# Patient Record
Sex: Male | Born: 1965 | Race: White | Hispanic: No | Marital: Single | Smoking: Current every day smoker
Health system: Southern US, Community
[De-identification: ages and names within clinical notes are randomized; demographics above are authoritative.]

## PROBLEM LIST (undated history)

## (undated) HISTORY — PX: TOTAL KNEE ARTHROPLASTY: SHX125

---

## 1998-07-31 ENCOUNTER — Encounter: Payer: Self-pay | Admitting: Emergency Medicine

## 1998-07-31 ENCOUNTER — Emergency Department (HOSPITAL_COMMUNITY): Admission: EM | Admit: 1998-07-31 | Discharge: 1998-07-31 | Payer: Self-pay | Admitting: Emergency Medicine

## 2010-05-01 ENCOUNTER — Other Ambulatory Visit: Payer: Self-pay | Admitting: Occupational Medicine

## 2010-05-01 ENCOUNTER — Ambulatory Visit: Payer: Self-pay

## 2010-05-01 DIAGNOSIS — M79643 Pain in unspecified hand: Secondary | ICD-10-CM

## 2012-12-11 ENCOUNTER — Emergency Department (HOSPITAL_COMMUNITY)
Admission: EM | Admit: 2012-12-11 | Discharge: 2012-12-11 | Disposition: A | Discharge status: Home / Self Care | Payer: No Typology Code available for payment source | Attending: Emergency Medicine | Admitting: Emergency Medicine

## 2012-12-11 ENCOUNTER — Encounter (HOSPITAL_COMMUNITY): Payer: Self-pay | Admitting: *Deleted

## 2012-12-11 DIAGNOSIS — K029 Dental caries, unspecified: Secondary | ICD-10-CM | POA: Insufficient documentation

## 2012-12-11 DIAGNOSIS — K0520 Aggressive periodontitis, unspecified: Secondary | ICD-10-CM | POA: Insufficient documentation

## 2012-12-11 DIAGNOSIS — Z720 Tobacco use: Secondary | ICD-10-CM

## 2012-12-11 DIAGNOSIS — J029 Acute pharyngitis, unspecified: Secondary | ICD-10-CM

## 2012-12-11 DIAGNOSIS — F17200 Nicotine dependence, unspecified, uncomplicated: Secondary | ICD-10-CM | POA: Insufficient documentation

## 2012-12-11 DIAGNOSIS — J039 Acute tonsillitis, unspecified: Secondary | ICD-10-CM | POA: Insufficient documentation

## 2012-12-11 DIAGNOSIS — K0530 Chronic periodontitis, unspecified: Secondary | ICD-10-CM

## 2012-12-11 LAB — RAPID STREP SCREEN (MED CTR MEBANE ONLY): Streptococcus, Group A Screen (Direct): NEGATIVE

## 2012-12-11 MED ORDER — PENICILLIN G BENZATHINE & PROC 1200000 UNIT/2ML IM SUSP
2.4000 10*6.[IU] | Freq: Once | INTRAMUSCULAR | Status: AC
  Administered 2012-12-11: 2.4 10*6.[IU] via INTRAMUSCULAR
  Filled 2012-12-11: qty 4

## 2012-12-11 NOTE — ED Notes (Signed)
Penicillin given in deep muscle upper outer portion of L and R buttocks.  Pt tolerated well.

## 2012-12-11 NOTE — ED Provider Notes (Signed)
CSN: [id number]     Arrival date & time 12/11/12  0536 History   First MD Initiated Contact with Patient 12/11/12 0542     Chief Complaint  Patient presents with  . Sore Throat   (Consider location/radiation/quality/duration/timing/severity/associated sxs/prior Treatment) HPI Patient is a 47 yo man with active periodontal disease who smokes 1ppd. He presents with complaints of sore throat x 24h. Pain is moderately severe and nonradiating. Pt is able to swallow. No sensation of airway closing.   Patient notes that he has severe periodontal disease and says that he thinks an infection spread from his gums to his throat. He denies URI sx. Pain in throat is nonradiating. Worse with swallowing.   History reviewed. No pertinent past medical history. Past Surgical History  Procedure Laterality Date  . Total knee arthroplasty     History reviewed. No pertinent family history. History  Substance Use Topics  . Smoking status: Current Every Day Smoker  . Smokeless tobacco: Never Used  . Alcohol Use: No    Review of Systems 10 point ROS obtained and otherwise negative.   Allergies  Review of patient's allergies indicates no known allergies.  Home Medications  No current outpatient prescriptions on file. BP 143/75  Temp(Src) 98.4 F (36.9 C) (Oral)  Resp 18  SpO2 96% Physical Exam Gen: well developed and well nourished appearing Head: NCAT Eyes: PERL, EOMI Nose: no epistaixis or rhinorrhea Mouth/throat: mucosa is moist and pink, diffuse dental caries, diffuse and severe periodontal disease. There is inflammation of the posterior oropharynx with scant amount of exudate of the tonsils bilaterally, the uvula is midline Neck: supple, no stridor, no lymphadenopathy Lungs: CTA B, no wheezing, rhonchi or rales CV: RRR Abd: soft, notender, nondistended Back: normal to inspection Skin: warm and dry Neuro: CN ii-xii grossly intact, no focal deficits Psyche; normal affect,  calm and  cooperative.   ED Course  Procedures (including critical care time)   MDM  1. Tonsillitis 2.  Periodontal disease 3.  Tobacco abuse disorder.   We will tx #1 with Benzanthine PCN IM. I have counseled the patient re: importance of smoking cessation. He says he has cut down from 3ppd and is working on quitting. Unfortunately, the patient does not have dental insurance and can't afford to see a dentist, he says. He is in need of aggressive periodontal care.  He has diffuse dental caries and we will refer [facility] on Sept [facility] and Sept 27.     Brandt Loosen, MD 12/11/12 [phone]

## 2012-12-11 NOTE — ED Notes (Signed)
Pt c/o sore throat for 3 days. Pt reports difficulty swallowing. Airway intact, no respiratory distress noted.

## 2012-12-12 LAB — CULTURE, GROUP A STREP

## 2013-01-21 ENCOUNTER — Emergency Department (HOSPITAL_COMMUNITY)
Admission: EM | Admit: 2013-01-21 | Discharge: 2013-01-21 | Disposition: A | Discharge status: Home / Self Care | Payer: No Typology Code available for payment source | Attending: Emergency Medicine | Admitting: Emergency Medicine

## 2013-01-21 ENCOUNTER — Encounter (HOSPITAL_COMMUNITY): Payer: Self-pay | Admitting: Emergency Medicine

## 2013-01-21 ENCOUNTER — Emergency Department (HOSPITAL_COMMUNITY): Payer: No Typology Code available for payment source

## 2013-01-21 DIAGNOSIS — IMO0002 Reserved for concepts with insufficient information to code with codable children: Secondary | ICD-10-CM

## 2013-01-21 DIAGNOSIS — Y939 Activity, unspecified: Secondary | ICD-10-CM | POA: Insufficient documentation

## 2013-01-21 DIAGNOSIS — Y929 Unspecified place or not applicable: Secondary | ICD-10-CM | POA: Insufficient documentation

## 2013-01-21 DIAGNOSIS — S62639A Displaced fracture of distal phalanx of unspecified finger, initial encounter for closed fracture: Secondary | ICD-10-CM | POA: Insufficient documentation

## 2013-01-21 DIAGNOSIS — F17200 Nicotine dependence, unspecified, uncomplicated: Secondary | ICD-10-CM | POA: Insufficient documentation

## 2013-01-21 DIAGNOSIS — S61209A Unspecified open wound of unspecified finger without damage to nail, initial encounter: Secondary | ICD-10-CM | POA: Insufficient documentation

## 2013-01-21 DIAGNOSIS — W268XXA Contact with other sharp object(s), not elsewhere classified, initial encounter: Secondary | ICD-10-CM | POA: Insufficient documentation

## 2013-01-21 MED ORDER — CEPHALEXIN 500 MG PO CAPS: 500.0000 mg | ORAL_CAPSULE | Freq: Four times a day (QID) | ORAL | Status: DC

## 2013-01-21 MED ORDER — OXYCODONE-ACETAMINOPHEN 5-325 MG PO TABS
2.0000 | ORAL_TABLET | Freq: Once | ORAL | Status: AC
  Administered 2013-01-21: 2 via ORAL
  Filled 2013-01-21: qty 2

## 2013-01-21 MED ORDER — OXYCODONE-ACETAMINOPHEN 5-325 MG PO TABS: 2.0000 | ORAL_TABLET | Freq: Four times a day (QID) | ORAL | Status: DC | PRN
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
## 2013-01-21 NOTE — ED Notes (Signed)
C/o laceration/avulsion to L thumb from the string on a crossbow around 7pm.  Bandage changed on arrival.  Bleeding controlled. Last DT 3 yrs ago.

## 2013-01-22 NOTE — ED Provider Notes (Signed)
Medical screening examination/treatment/procedure(s) were performed by non-physician practitioner and as supervising physician I was immediately available for consultation/collaboration.  EKG Interpretation   None         Katilin Raynes, MD 01/22/13 0003 

## 2013-08-16 ENCOUNTER — Encounter (HOSPITAL_COMMUNITY): Payer: Self-pay | Admitting: Emergency Medicine

## 2013-08-16 ENCOUNTER — Emergency Department (HOSPITAL_COMMUNITY): Payer: No Typology Code available for payment source

## 2013-08-16 ENCOUNTER — Emergency Department (HOSPITAL_COMMUNITY)
Admission: EM | Admit: 2013-08-16 | Discharge: 2013-08-16 | Disposition: A | Discharge status: Home / Self Care | Payer: No Typology Code available for payment source | Attending: Emergency Medicine | Admitting: Emergency Medicine

## 2013-08-16 DIAGNOSIS — Z96659 Presence of unspecified artificial knee joint: Secondary | ICD-10-CM | POA: Insufficient documentation

## 2013-08-16 DIAGNOSIS — Z87828 Personal history of other (healed) physical injury and trauma: Secondary | ICD-10-CM | POA: Insufficient documentation

## 2013-08-16 DIAGNOSIS — M1710 Unilateral primary osteoarthritis, unspecified knee: Secondary | ICD-10-CM | POA: Insufficient documentation

## 2013-08-16 DIAGNOSIS — F17200 Nicotine dependence, unspecified, uncomplicated: Secondary | ICD-10-CM | POA: Insufficient documentation

## 2013-08-16 DIAGNOSIS — IMO0002 Reserved for concepts with insufficient information to code with codable children: Secondary | ICD-10-CM | POA: Insufficient documentation

## 2013-08-16 MED ORDER — PREDNISONE 10 MG PO TABS: 20.0000 mg | ORAL_TABLET | Freq: Every day | ORAL | Status: AC

## 2013-08-16 MED ORDER — OXYCODONE-ACETAMINOPHEN 5-325 MG PO TABS: 1.0000 | ORAL_TABLET | Freq: Four times a day (QID) | ORAL | Status: AC | PRN

## 2013-08-16 MED ORDER — OXYCODONE-ACETAMINOPHEN 5-325 MG PO TABS
2.0000 | ORAL_TABLET | Freq: Once | ORAL | Status: AC
  Administered 2013-08-16: 2 via ORAL
  Filled 2013-08-16: qty 2

## 2013-08-16 MED ORDER — OXYCODONE-ACETAMINOPHEN 5-325 MG PO TABS
1.0000 | ORAL_TABLET | Freq: Once | ORAL | Status: AC
  Administered 2013-08-16: 1 via ORAL
  Filled 2013-08-16: qty 1

## 2013-08-16 NOTE — Discharge Instructions (Signed)
Osteoarthritis °Osteoarthritis is a disease that causes soreness and swelling (inflammation) of a joint. It occurs when the cartilage at the affected joint wears down. Cartilage acts as a cushion, covering the ends of bones where they meet to form a joint. Osteoarthritis is the most common form of arthritis. It often occurs in older people. The joints affected most often by this condition include those in the: °· Ends of the fingers. °· Thumbs. °· Neck. °· Lower back. °· Knees. °· Hips. °CAUSES  °Over time, the cartilage that covers the ends of bones begins to wear away. This causes bone to rub on bone, producing pain and stiffness in the affected joints.  °RISK FACTORS °Certain factors can increase your chances of having osteoarthritis, including: °· Older age. °· Excessive body weight. °· Overuse of joints. °SIGNS AND SYMPTOMS  °· Pain, swelling, and stiffness in the joint. °· Over time, the joint may lose its normal shape. °· Small deposits of bone (osteophytes) may grow on the edges of the joint. °· Bits of bone or cartilage can break off and float inside the joint space. This may cause more pain and damage. °DIAGNOSIS  °Your health care provider will do a physical exam and ask about your symptoms. Various tests may be ordered, such as: °· X-rays of the affected joint. °· An MRI scan. °· Blood tests to rule out other types of arthritis. °· Joint fluid tests. This involves using a needle to draw fluid from the joint and examining the fluid under a microscope. °TREATMENT  °Goals of treatment are to control pain and improve joint function. Treatment plans may include: °· A prescribed exercise program that allows for rest and joint relief. °· A weight control plan. °· Pain relief techniques, such as: °· Properly applied heat and cold. °· Electric pulses delivered to nerve endings under the skin (transcutaneous electrical nerve stimulation, TENS). °· Massage. °· Certain nutritional supplements. °· Medicines to  control pain, such as: °· Acetaminophen. °· Nonsteroidal anti-inflammatory drugs (NSAIDs), such as naproxen. °· Narcotic or central-acting agents, such as tramadol. °· Corticosteroids. These can be given orally or as an injection. °· Surgery to reposition the bones and relieve pain (osteotomy) or to remove loose pieces of bone and cartilage. Joint replacement may be needed in advanced states of osteoarthritis. °HOME CARE INSTRUCTIONS  °· Only take over-the-counter or prescription medicines as directed by your health care provider. Take all medicines exactly as instructed. °· Maintain a healthy weight. Follow your health care provider's instructions for weight control. This may include dietary instructions. °· Exercise as directed. Your health care provider can recommend specific types of exercise. These may include: °· Strengthening exercises These are done to strengthen the muscles that support joints affected by arthritis. They can be performed with weights or with exercise bands to add resistance. °· Aerobic activities These are exercises, such as brisk walking or low-impact aerobics, that get your heart pumping. °· Range-of-motion activities These keep your joints limber. °· Balance and agility exercises These help you maintain daily living skills. °· Rest your affected joints as directed by your health care provider. °· Follow up with your health care provider as directed. °SEEK MEDICAL CARE IF:  °· Your skin turns red. °· You develop a rash in addition to your joint pain. °· You have worsening joint pain. °SEEK IMMEDIATE MEDICAL CARE IF: °· You have a significant loss of weight or appetite. °· You have a fever along with joint or muscle aches. °· You have   night sweats. °FOR MORE INFORMATION  °National Institute of Arthritis and Musculoskeletal and Skin Diseases: www.niams.nih.gov °National Institute on Aging: www.nia.nih.gov °American College of Rheumatology: www.rheumatology.org °Document Released: 03/15/2005  Document Revised: 01/03/2013 Document Reviewed: 11/20/2012 °ExitCare® Patient Information ©2014 [employer]. ° °Wear and Tear Disorders of the Knee (Arthritis, Osteoarthritis) °Everyone will experience wear and tear injuries (arthritis, osteoarthritis) of the knee. These are the changes we all get as we age. They come from the joint stress of daily living. The amount of cartilage damage in your knee and your symptoms determine if you need surgery. Mild problems require approximately two months recovery time. More severe problems take several months to recover. With mild problems, your surgeon may find worn and rough cartilage surfaces. With severe changes, your surgeon may find cartilage that has completely worn away and exposed the bone. Loose bodies of bone and cartilage, bone spurs (excess bone growth), and injuries to the menisci (cushions between the large bones of your leg) are also common. All of these problems can cause pain. °For a mild wear and tear problem, rough cartilage may simply need to be shaved and smoothed. For more severe problems with areas of exposed bone, your surgeon may use an instrument for roughing up the bone surfaces to stimulate new cartilage growth. Loose bodies are usually removed. Torn menisci may be trimmed or repaired. °ABOUT THE ARTHROSCOPIC PROCEDURE °Arthroscopy is a surgical technique. It allows your orthopedic surgeon to diagnose and treat your knee injury with accuracy. The surgeon looks into your knee through a small scope. The scope is like a small (pencil-sized) telescope. Arthroscopy is less invasive than open knee surgery. You can expect a more rapid recovery. After the procedure, you will be moved to a recovery area until most of the effects of the medication have worn off. Your caregiver will discuss the test results with you. °RECOVERY °The severity of the arthritis and the type of procedure performed will determine recovery time. Other important factors include  age, physical condition, medical conditions, and the type of rehabilitation program. Strengthening your muscles after arthroscopy helps guarantee a better recovery. Follow your caregiver's instructions. Use crutches, rest, elevate, ice, and do knee exercises as instructed. Your caregivers will help you and instruct you with exercises and other physical therapy required to regain your mobility, muscle strength, and functioning following surgery. Only take over-the-counter or prescription medicines for pain, discomfort, or fever as directed by your caregiver.  °SEEK MEDICAL CARE IF:  °· There is increased bleeding (more than a small spot) from the wound. °· You notice redness, swelling, or increasing pain in the wound. °· Pus is coming from wound. °· You develop an unexplained oral temperature above 102° F (38.9° C) , or as your caregiver suggests. °· You notice a foul smell coming from the wound or dressing. °· You have severe pain with motion of the knee. °SEEK IMMEDIATE MEDICAL CARE IF:  °· You develop a rash. °· You have difficulty breathing. °· You have any allergic problems. °MAKE SURE YOU:  °· Understand these instructions. °· Will watch your condition. °· Will get help right away if you are not doing well or get worse. °Document Released: 03/12/2000 Document Revised: 06/07/2011 Document Reviewed: 08/09/2007 °ExitCare® Patient Information ©2014 [employer]. ° °

## 2013-08-16 NOTE — ED Notes (Signed)
Pt c/o pain to left knee onset yesterday. Pt reports that he has a knee replacement to right knee. Pt felt pain yesterday after getting up from toilet. Pt has tried ibuprofen without relief.

## 2013-08-16 NOTE — ED Notes (Signed)
Patient returned from xray.
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
## 2013-08-17 NOTE — ED Provider Notes (Signed)
Medical screening examination/treatment/procedure(s) were performed by non-physician practitioner and as supervising physician I was immediately available for consultation/collaboration.   EKG Interpretation None        Christopher J. Pollina, MD 08/17/13 0841 

## 2013-12-12 ENCOUNTER — Encounter (HOSPITAL_COMMUNITY): Payer: Self-pay | Admitting: Emergency Medicine

## 2013-12-12 ENCOUNTER — Emergency Department (HOSPITAL_COMMUNITY): Payer: No Typology Code available for payment source

## 2013-12-12 ENCOUNTER — Emergency Department (HOSPITAL_COMMUNITY)
Admission: EM | Admit: 2013-12-12 | Discharge: 2013-12-12 | Disposition: A | Discharge status: Home / Self Care | Payer: No Typology Code available for payment source | Attending: Emergency Medicine | Admitting: Emergency Medicine

## 2013-12-12 DIAGNOSIS — S99919A Unspecified injury of unspecified ankle, initial encounter: Principal | ICD-10-CM

## 2013-12-12 DIAGNOSIS — S8990XA Unspecified injury of unspecified lower leg, initial encounter: Secondary | ICD-10-CM | POA: Insufficient documentation

## 2013-12-12 DIAGNOSIS — M25562 Pain in left knee: Secondary | ICD-10-CM

## 2013-12-12 DIAGNOSIS — Z96659 Presence of unspecified artificial knee joint: Secondary | ICD-10-CM | POA: Insufficient documentation

## 2013-12-12 DIAGNOSIS — R296 Repeated falls: Secondary | ICD-10-CM | POA: Insufficient documentation

## 2013-12-12 DIAGNOSIS — S99929A Unspecified injury of unspecified foot, initial encounter: Principal | ICD-10-CM

## 2013-12-12 DIAGNOSIS — F17200 Nicotine dependence, unspecified, uncomplicated: Secondary | ICD-10-CM | POA: Insufficient documentation

## 2013-12-12 DIAGNOSIS — IMO0002 Reserved for concepts with insufficient information to code with codable children: Secondary | ICD-10-CM | POA: Insufficient documentation

## 2013-12-12 DIAGNOSIS — Y9389 Activity, other specified: Secondary | ICD-10-CM | POA: Insufficient documentation

## 2013-12-12 DIAGNOSIS — Y929 Unspecified place or not applicable: Secondary | ICD-10-CM | POA: Insufficient documentation

## 2013-12-12 MED ORDER — OXYCODONE-ACETAMINOPHEN 5-325 MG PO TABS: 1.0000 | ORAL_TABLET | ORAL | Status: AC | PRN

## 2013-12-12 MED ORDER — OXYCODONE-ACETAMINOPHEN 5-325 MG PO TABS
2.0000 | ORAL_TABLET | Freq: Once | ORAL | Status: AC
  Administered 2013-12-12: 2 via ORAL
  Filled 2013-12-12: qty 2

## 2013-12-12 MED ORDER — ONDANSETRON 4 MG PO TBDP
8.0000 mg | ORAL_TABLET | Freq: Once | ORAL | Status: AC
  Administered 2013-12-12: 8 mg via ORAL
  Filled 2013-12-12: qty 2

## 2013-12-12 NOTE — ED Notes (Signed)
Pt reports pain to left knee this am and his "knee gave out on him." ambulatory at triage.

## 2013-12-12 NOTE — Discharge Instructions (Signed)
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
## 2013-12-15 NOTE — ED Provider Notes (Signed)
Medical screening examination/treatment/procedure(s) were performed by non-physician practitioner and as supervising physician I was immediately available for consultation/collaboration.  Audree Camel, MD 12/15/13 [phone]

## 2015-02-27 IMAGING — CR DG KNEE COMPLETE 4+V*L*
4 series · 4 of 4 positions shown · non-contrast
Comparison: Left knee series of August 16, 2013

CLINICAL DATA: Status post fall this morning now with pain over the
medial aspect of the knee

EXAM:
LEFT KNEE - COMPLETE 4+ VIEW

[t knee ap left]
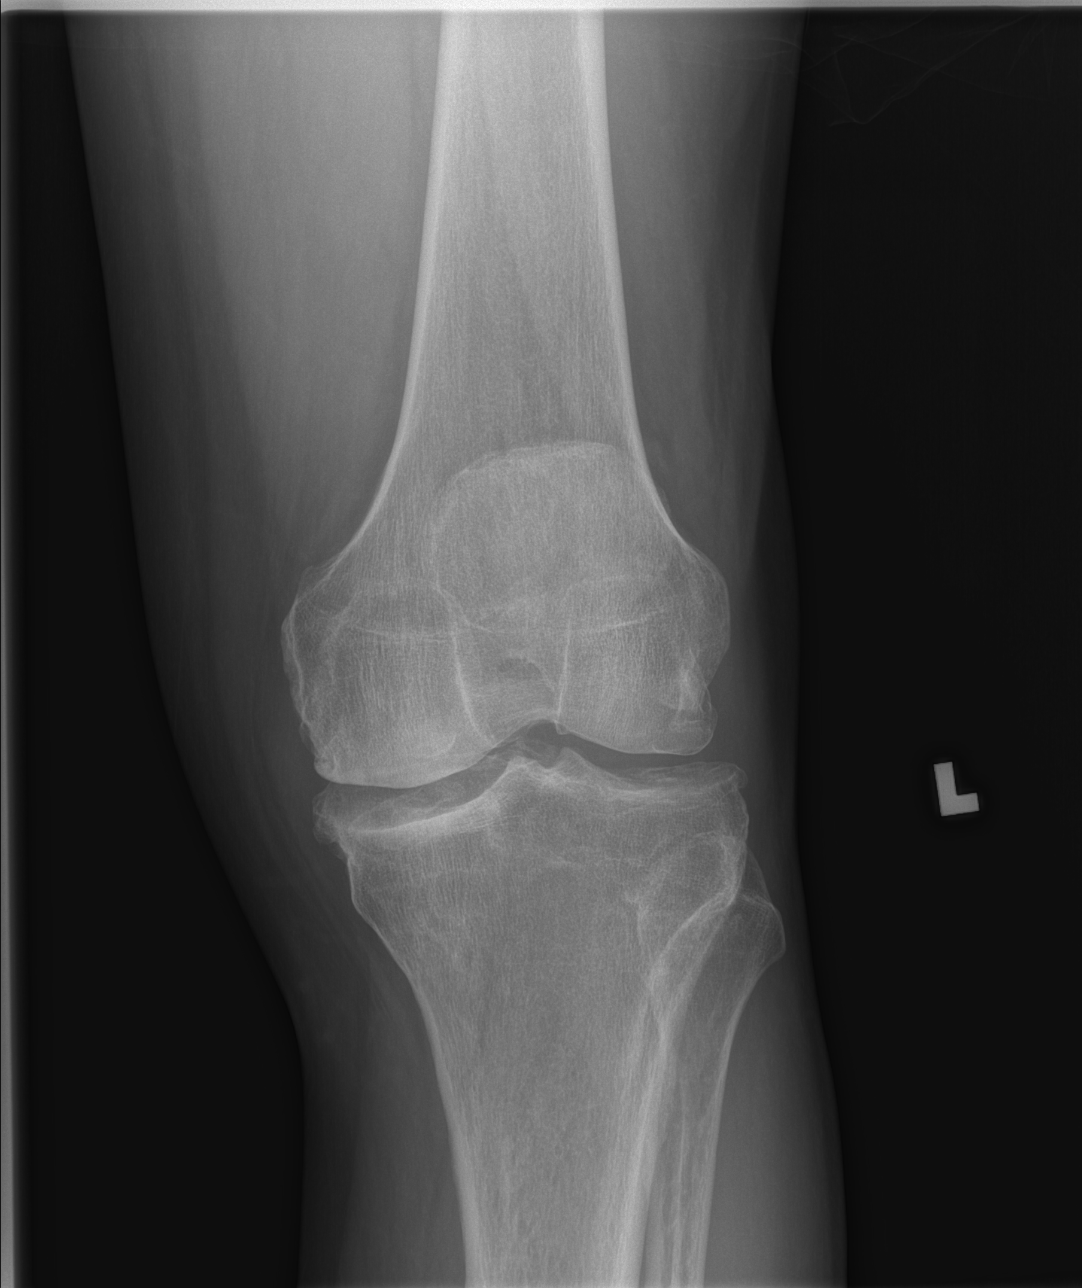

[t knee obl left (1 of 2)]
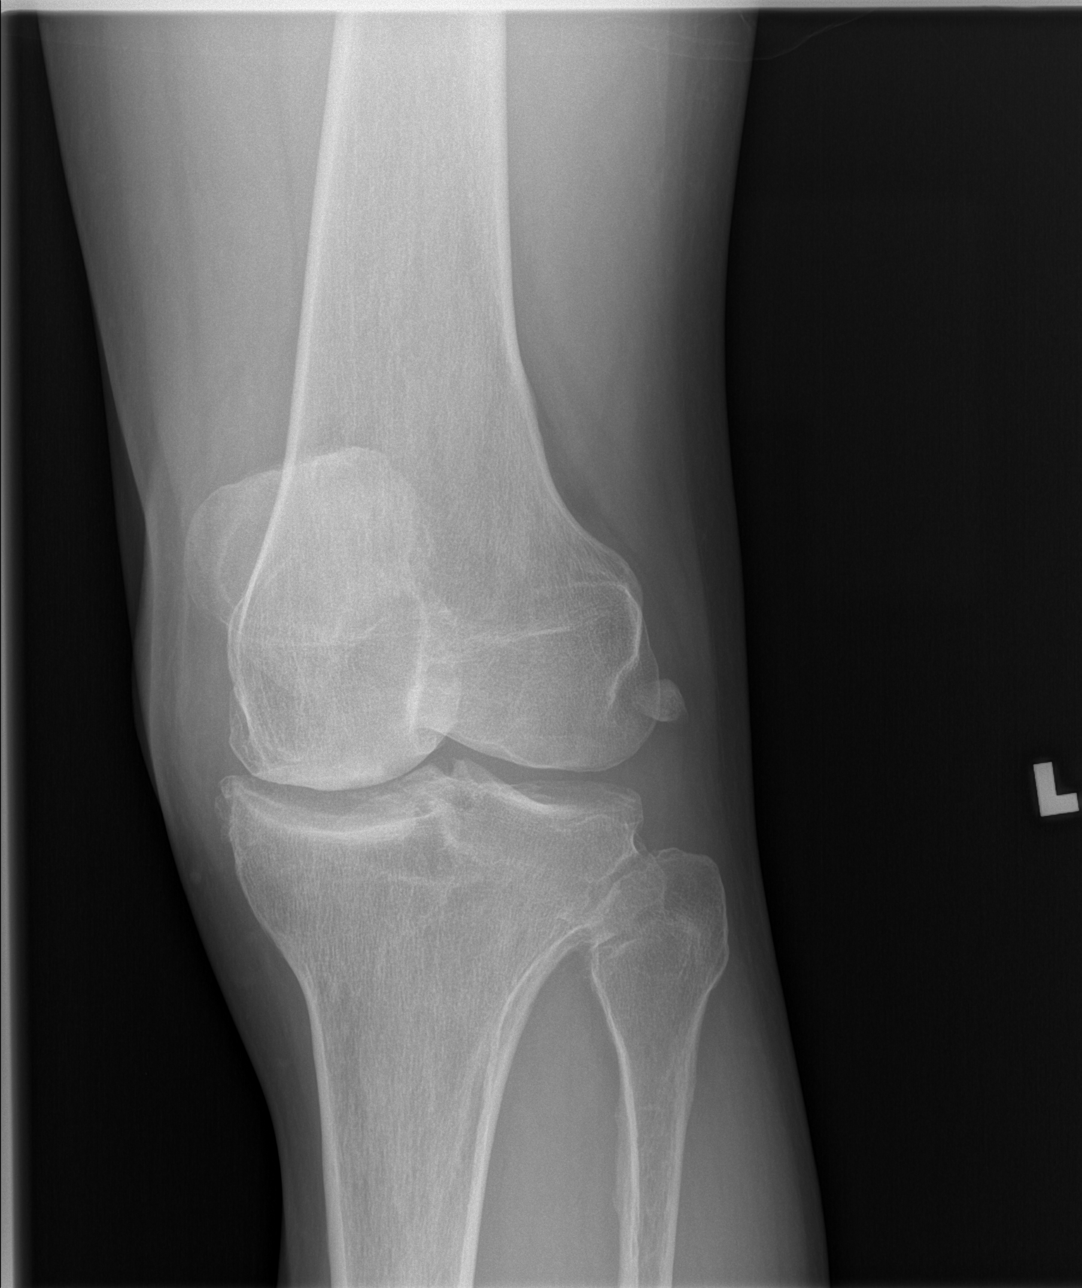

[t knee obl left (2 of 2)]
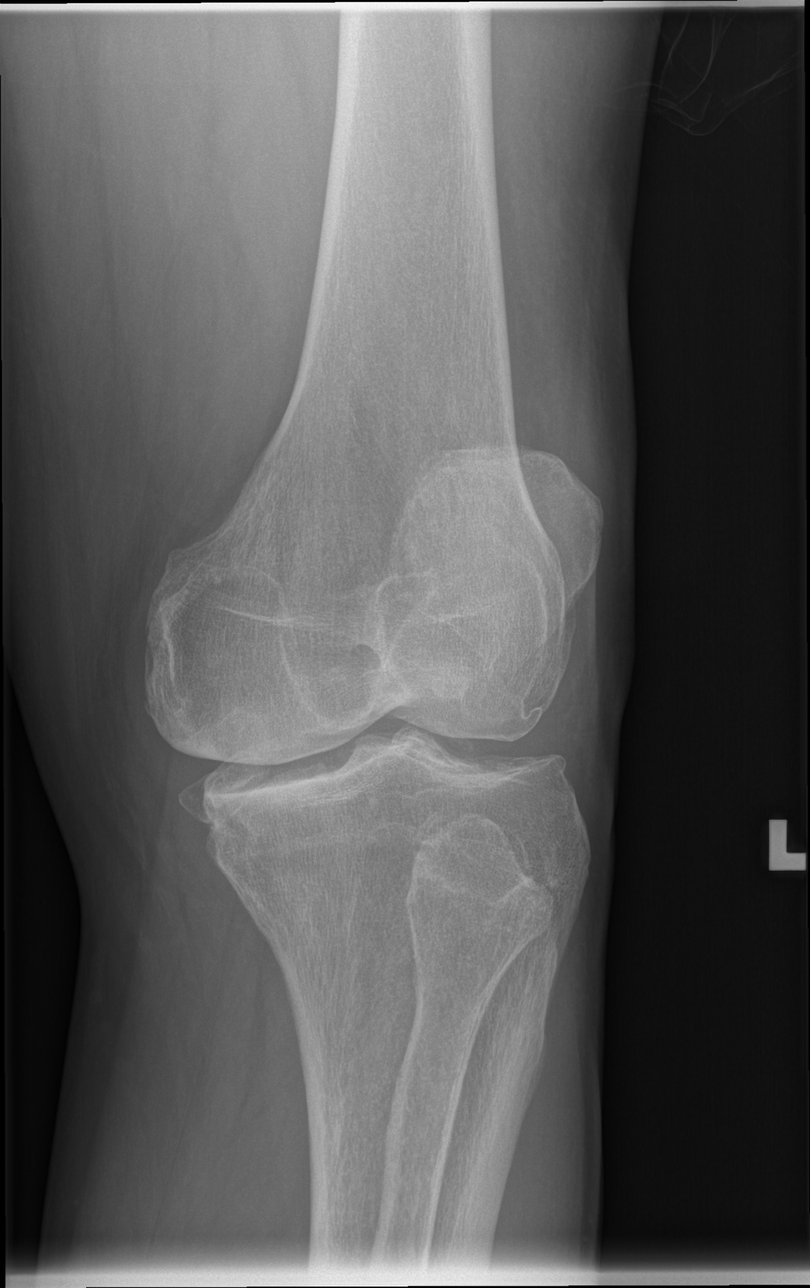

[t knee lat left]
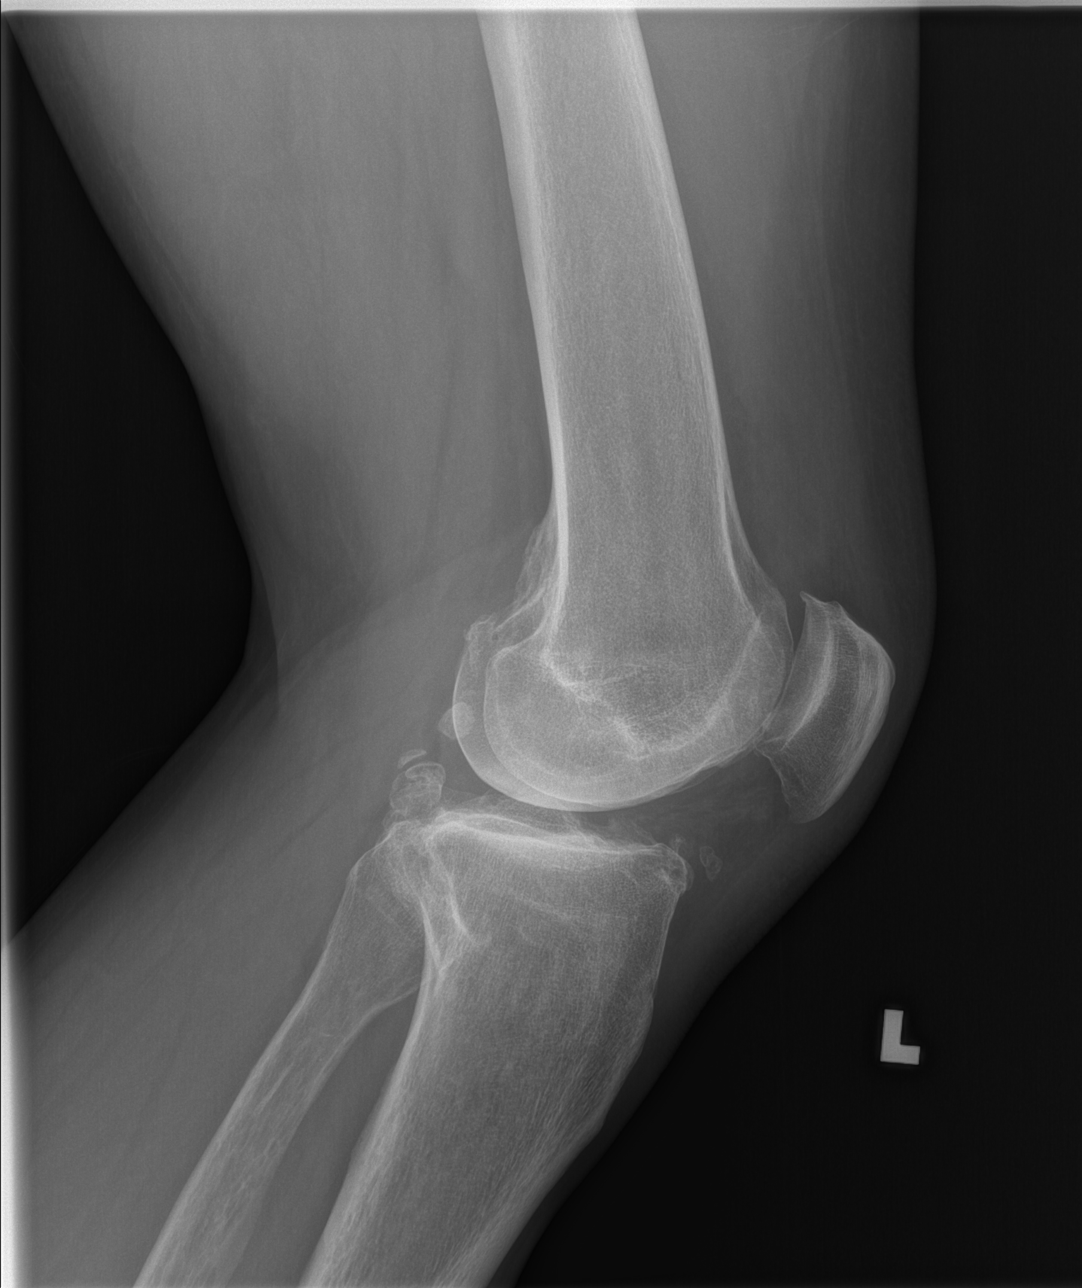

[4 of 4 positions shown; findings below may reference images not displayed]

FINDINGS: The bones are reasonably well and mineralized. There is no acute
fracture nor dislocation. There is degenerative change involving all
3 joint compartments which appears little changed from the previous
study. Calcified loose bodies are present. No joint effusion is
evident.
IMPRESSION: There is not a stable appearance of the left knee with degenerative
changes of all 3 joint compartments. There is no acute fracture or
dislocation.

## 2015-03-26 ENCOUNTER — Emergency Department (HOSPITAL_COMMUNITY)
Admission: EM | Admit: 2015-03-26 | Discharge: 2015-03-26 | Disposition: A | Discharge status: Home / Self Care | Payer: No Typology Code available for payment source | Attending: Emergency Medicine | Admitting: Emergency Medicine

## 2015-03-26 ENCOUNTER — Encounter (HOSPITAL_COMMUNITY): Payer: Self-pay | Admitting: *Deleted

## 2015-03-26 DIAGNOSIS — K0889 Other specified disorders of teeth and supporting structures: Secondary | ICD-10-CM | POA: Insufficient documentation

## 2015-03-26 DIAGNOSIS — R197 Diarrhea, unspecified: Secondary | ICD-10-CM | POA: Insufficient documentation

## 2015-03-26 DIAGNOSIS — K029 Dental caries, unspecified: Secondary | ICD-10-CM | POA: Insufficient documentation

## 2015-03-26 DIAGNOSIS — F17200 Nicotine dependence, unspecified, uncomplicated: Secondary | ICD-10-CM | POA: Insufficient documentation

## 2015-03-26 DIAGNOSIS — Z720 Tobacco use: Secondary | ICD-10-CM

## 2015-03-26 DIAGNOSIS — K047 Periapical abscess without sinus: Secondary | ICD-10-CM | POA: Insufficient documentation

## 2015-03-26 MED ORDER — HYDROCODONE-ACETAMINOPHEN 5-325 MG PO TABS: 1.0000 | ORAL_TABLET | Freq: Four times a day (QID) | ORAL | Status: AC | PRN

## 2015-03-26 MED ORDER — MORPHINE SULFATE (PF) 4 MG/ML IV SOLN
4.0000 mg | Freq: Once | INTRAVENOUS | Status: AC
  Administered 2015-03-26: 4 mg via INTRAVENOUS
  Filled 2015-03-26: qty 1

## 2015-03-26 MED ORDER — OXYCODONE-ACETAMINOPHEN 5-325 MG PO TABS
1.0000 | ORAL_TABLET | Freq: Once | ORAL | Status: AC
Start: 2015-03-26 — End: 2015-03-26
  Administered 2015-03-26: 1 via ORAL

## 2015-03-26 MED ORDER — PENICILLIN V POTASSIUM 500 MG PO TABS: 1000.0000 mg | ORAL_TABLET | Freq: Two times a day (BID) | ORAL | Status: AC

## 2015-03-26 MED ORDER — NAPROXEN 500 MG PO TABS: 500.0000 mg | ORAL_TABLET | Freq: Two times a day (BID) | ORAL | Status: AC | PRN

## 2015-03-26 MED ORDER — OXYCODONE-ACETAMINOPHEN 5-325 MG PO TABS
ORAL_TABLET | ORAL | Status: AC
  Filled 2015-03-26: qty 1

## 2015-03-26 MED ORDER — SODIUM CHLORIDE 0.9 % IV SOLN
3.0000 g | Freq: Once | INTRAVENOUS | Status: AC
  Administered 2015-03-26: 3 g via INTRAVENOUS
  Filled 2015-03-26: qty 3

## 2015-03-26 NOTE — Discharge Instructions (Signed)
Apply warm compresses to jaw throughout the day. Take antibiotic until finished. Take naprosyn and norco as directed, as needed for pain but do not drive or operate machinery with pain medication use. STOP SMOKING! Followup with a dentist is very important for ongoing evaluation and management of recurrent dental pain. Use the list below to find a dentist in the next 1-2 days. Use over the counter immodium as needed for diarrhea, keep in mind that the diarrhea may be from the infection and the antibiotics you've been given may cause diarrhea to worsen while you're taking them, but this is a normal side effect. Follow a BRAT (banana-rice-applesauce-toast) diet as described below for the next 24-48 hours. The 'BRAT' diet is suggested, then progress to diet as tolerated as symptoms abate. Call your regular doctor if bloody stools, persistent diarrhea, vomiting, fever or abdominal pain. Return to ER for changing or worsening of symptoms.    Food Choices to Help Relieve Diarrhea When you have diarrhea, the foods you eat and your eating habits are very important. Choosing the right foods and drinks can help relieve diarrhea. Also, because diarrhea can last up to 7 days, you need to replace lost fluids and electrolytes (such as sodium, potassium, and chloride) in order to help prevent dehydration.  WHAT GENERAL GUIDELINES DO I NEED TO FOLLOW?  Slowly drink 1 cup (8 oz) of fluid for each episode of diarrhea. If you are getting enough fluid, your urine will be clear or pale yellow.  Eat starchy foods. Some good choices include white rice, white toast, pasta, low-fiber cereal, baked potatoes (without the skin), saltine crackers, and bagels.  Avoid large servings of any cooked vegetables.  Limit fruit to two servings per day. A serving is  cup or 1 small piece.  Choose foods with less than 2 g of fiber per serving.  Limit fats to less than 8 tsp (38 g) per day.  Avoid fried foods.  Eat foods that have  probiotics in them. Probiotics can be found in certain dairy products.  Avoid foods and beverages that may increase the speed at which food moves through the stomach and intestines (gastrointestinal tract). Things to avoid include:  High-fiber foods, such as dried fruit, raw fruits and vegetables, nuts, seeds, and whole grain foods.  Spicy foods and high-fat foods.  Foods and beverages sweetened with high-fructose corn syrup, honey, or sugar alcohols such as xylitol, sorbitol, and mannitol. WHAT FOODS ARE RECOMMENDED? Grains White rice. White, [place], or pita breads (fresh or toasted), including plain rolls, buns, or bagels. White pasta. Saltine, soda, or graham crackers. Pretzels. Low-fiber cereal. Cooked cereals made with water (such as cornmeal, farina, or cream cereals). Plain muffins. Matzo. Melba toast. Zwieback.  Vegetables Potatoes (without the skin). Strained tomato and vegetable juices. Most well-cooked and canned vegetables without seeds. Tender lettuce. Fruits Cooked or canned applesauce, apricots, cherries, fruit cocktail, grapefruit, peaches, pears, or plums. Fresh bananas, apples without skin, cherries, grapes, cantaloupe, grapefruit, peaches, oranges, or plums.  Meat and Other Protein Products Baked or boiled chicken. Eggs. Tofu. Fish. Seafood. Smooth peanut butter. Ground or well-cooked tender beef, ham, veal, lamb, pork, or poultry.  Dairy Plain yogurt, kefir, and unsweetened liquid yogurt. Lactose-free milk, buttermilk, or soy milk. Plain hard cheese. Beverages Sport drinks. Clear broths. Diluted fruit juices (except prune). Regular, caffeine-free sodas such as ginger ale. Water. Decaffeinated teas. Oral rehydration solutions. Sugar-free beverages not sweetened with sugar alcohols. Other Bouillon, broth, or soups made from recommended foods.  The  items listed above may not be a complete list of recommended foods or beverages. Contact your dietitian for more options. WHAT  FOODS ARE NOT RECOMMENDED? Grains Whole grain, whole wheat, bran, or rye breads, rolls, pastas, crackers, and cereals. Wild or brown rice. Cereals that contain more than 2 g of fiber per serving. Corn tortillas or taco shells. Cooked or dry oatmeal. Granola. Popcorn. Vegetables Raw vegetables. Cabbage, broccoli, Brussels sprouts, artichokes, baked beans, beet greens, corn, kale, legumes, peas, sweet potatoes, and yams. Potato skins. Cooked spinach and cabbage. Fruits Dried fruit, including raisins and dates. Raw fruits. Stewed or dried prunes. Fresh apples with skin, apricots, mangoes, pears, raspberries, and strawberries.  Meat and Other Protein Products Chunky peanut butter. Nuts and seeds. Beans and lentils. Tomasa BlaseBacon.  Dairy High-fat cheeses. Milk, chocolate milk, and beverages made with milk, such as milk shakes. Cream. Ice cream. Sweets and Desserts Sweet rolls, doughnuts, and sweet breads. Pancakes and waffles. Fats and Oils Butter. Cream sauces. Margarine. Salad oils. Plain salad dressings. Olives. Avocados.  Beverages Caffeinated beverages (such as coffee, tea, soda, or energy drinks). Alcoholic beverages. Fruit juices with pulp. Prune juice. Soft drinks sweetened with high-fructose corn syrup or sugar alcohols. Other Coconut. Hot sauce. Chili powder. Mayonnaise. Gravy. Cream-based or milk-based soups.  The items listed above may not be a complete list of foods and beverages to avoid. Contact your dietitian for more information. WHAT SHOULD I DO IF I BECOME DEHYDRATED? Diarrhea can sometimes lead to dehydration. Signs of dehydration include dark urine and dry mouth and skin. If you think you are dehydrated, you should rehydrate with an oral rehydration solution. These solutions can be purchased at pharmacies, retail stores, or online.  Drink -1 cup (120-240 mL) of oral rehydration solution each time you have an episode of diarrhea. If drinking this amount makes your diarrhea worse, try  drinking smaller amounts more often. For example, drink 1-3 tsp (5-15 mL) every 5-10 minutes.  A general rule for staying hydrated is to drink 1-2 L of fluid per day. Talk to your health care provider about the specific amount you should be drinking each day. Drink enough fluids to keep your urine clear or pale yellow. Document Released: 06/05/2003 Document Revised: 03/20/2013 Document Reviewed: 02/05/2013 [facility] Patient Information 2015 [place]. This information is not intended to replace advice given to you by your health care provider. Make sure you discuss any questions you have with your health care provider.    Dental Abscess A dental abscess is a collection of pus in or around a tooth. CAUSES This condition is caused by a bacterial infection around the root of the tooth that involves the inner part of the tooth (pulp). It may result from:  Severe tooth decay.  Trauma to the tooth that allows bacteria to enter into the pulp, such as a broken or chipped tooth.  Severe gum disease around a tooth. SYMPTOMS Symptoms of this condition include:  Severe pain in and around the infected tooth.  Swelling and redness around the infected tooth, in the mouth, or in the face.  Tenderness.  Pus drainage.  Bad breath.  Bitter taste in the mouth.  Difficulty swallowing.  Difficulty opening the mouth.  Nausea.  Vomiting.  Chills.  Swollen neck glands.  Fever. DIAGNOSIS This condition is diagnosed with examination of the infected tooth. During the exam, your dentist may tap on the infected tooth. Your dentist will also ask about your medical and dental history and may order X-rays. TREATMENT  This condition is treated by eliminating the infection. This may be done with:  Antibiotic medicine.  A root canal. This may be performed to save the tooth.  Pulling (extracting) the tooth. This may also involve draining the abscess. This is done if the tooth cannot be  saved. HOME CARE INSTRUCTIONS  Take medicines only as directed by your dentist.  If you were prescribed antibiotic medicine, finish all of it even if you start to feel better.  Rinse your mouth (gargle) often with salt water to relieve pain or swelling.  Do not drive or operate heavy machinery while taking pain medicine.  Do not apply heat to the outside of your mouth.  Keep all follow-up visits as directed by your dentist. This is important. SEEK MEDICAL CARE IF:  Your pain is worse and is not helped by medicine. SEEK IMMEDIATE MEDICAL CARE IF:  You have a fever or chills.  Your symptoms suddenly get worse.  You have a very bad headache.  You have problems breathing or swallowing.  You have trouble opening your mouth.  You have swelling in your neck or around your eye.   This information is not intended to replace advice given to you by your health care provider. Make sure you discuss any questions you have with your health care provider.   Document Released: 03/15/2005 Document Revised: 07/30/2014 Document Reviewed: 03/12/2014 Elsevier Interactive Patient Education 2016 Elsevier Inc.  Dental Caries Dental caries is tooth decay. This decay can cause a hole in teeth (cavity) that can get bigger and deeper over time. HOME CARE  Brush and floss your teeth. Do this at least two times a day.  Use a fluoride toothpaste.  Use a mouth rinse if told by your dentist or doctor.  Eat less sugary and starchy foods. Drink less sugary drinks.  Avoid snacking often on sugary and starchy foods. Avoid sipping often on sugary drinks.  Keep regular checkups and cleanings with your dentist.  Use fluoride supplements if told by your dentist or doctor.  Allow fluoride to be applied to teeth if told by your dentist or doctor.   This information is not intended to replace advice given to you by your health care provider. Make sure you discuss any questions you have with your health  care provider.   Document Released: 12/23/2007 Document Revised: 04/05/2014 Document Reviewed: 03/17/2012 Elsevier Interactive Patient Education 2016 Elsevier Inc.  Diarrhea Diarrhea is frequent loose and watery bowel movements. It can cause you to feel weak and dehydrated. Dehydration can cause you to become tired and thirsty, have a dry mouth, and have decreased urination that often is dark yellow. Diarrhea is a sign of another problem, most often an infection that will not last long. In most cases, diarrhea typically lasts 2-3 days. However, it can last longer if it is a sign of something more serious. It is important to treat your diarrhea as directed by your caregiver to lessen or prevent future episodes of diarrhea. CAUSES  Some common causes include:  Gastrointestinal infections caused by viruses, bacteria, or parasites.  Food poisoning or food allergies.  Certain medicines, such as antibiotics, chemotherapy, and laxatives.  Artificial sweeteners and fructose.  Digestive disorders. HOME CARE INSTRUCTIONS  Ensure adequate fluid intake (hydration): Have 1 cup (8 oz) of fluid for each diarrhea episode. Avoid fluids that contain simple sugars or sports drinks, fruit juices, whole milk products, and sodas. Your urine should be clear or pale yellow if you are drinking enough fluids.  Hydrate with an oral rehydration solution that you can purchase at pharmacies, retail stores, and online. You can prepare an oral rehydration solution at home by mixing the following ingredients together:   - tsp table salt.   tsp baking soda.   tsp salt substitute containing potassium chloride.  1  tablespoons sugar.  1 L (34 oz) of water.  Certain foods and beverages may increase the speed at which food moves through the gastrointestinal (GI) tract. These foods and beverages should be avoided and include:  Caffeinated and alcoholic beverages.  High-fiber foods, such as raw fruits and vegetables,  nuts, seeds, and whole grain breads and cereals.  Foods and beverages sweetened with sugar alcohols, such as xylitol, sorbitol, and mannitol.  Some foods may be well tolerated and may help thicken stool including:  Starchy foods, such as rice, toast, pasta, low-sugar cereal, oatmeal, grits, baked potatoes, crackers, and bagels.  Bananas.  Applesauce.  Add probiotic-rich foods to help increase healthy bacteria in the GI tract, such as yogurt and fermented milk products.  Wash your hands well after each diarrhea episode.  Only take over-the-counter or prescription medicines as directed by your caregiver.  Take a warm bath to relieve any burning or pain from frequent diarrhea episodes. SEEK IMMEDIATE MEDICAL CARE IF:   You are unable to keep fluids down.  You have persistent vomiting.  You have blood in your stool, or your stools are black and tarry.  You do not urinate in 6-8 hours, or there is only a small amount of very dark urine.  You have abdominal pain that increases or localizes.  You have weakness, dizziness, confusion, or light-headedness.  You have a severe headache.  Your diarrhea gets worse or does not get better.  You have a fever or persistent symptoms for more than 2-3 days.  You have a fever and your symptoms suddenly get worse. MAKE SURE YOU:   Understand these instructions.  Will watch your condition.  Will get help right away if you are not doing well or get worse.   This information is not intended to replace advice given to you by your health care provider. Make sure you discuss any questions you have with your health care provider.   Document Released: 03/05/2002 Document Revised: 04/05/2014 Document Reviewed: 11/21/2011 Elsevier Interactive Patient Education 2016 [employer].  Smoking Cessation, Tips for Success If you are ready to quit smoking, congratulations! You have chosen to help yourself be healthier. Cigarettes bring nicotine, tar,  carbon monoxide, and other irritants into your body. Your lungs, heart, and blood vessels will be able to work better without these poisons. There are many different ways to quit smoking. Nicotine gum, nicotine patches, a nicotine inhaler, or nicotine nasal spray can help with physical craving. Hypnosis, support groups, and medicines help break the habit of smoking. WHAT THINGS CAN I DO TO MAKE QUITTING EASIER?  Here are some tips to help you quit for good:  Pick a date when you will quit smoking completely. Tell all of your friends and family about your plan to quit on that date.  Do not try to slowly cut down on the number of cigarettes you are smoking. Pick a quit date and quit smoking completely starting on that day.  Throw away all cigarettes.   Clean and remove all ashtrays from your home, work, and car.  On a card, write down your reasons for quitting. Carry the card with you and read it when you get the  urge to smoke.  Cleanse your body of nicotine. Drink enough water and fluids to keep your urine clear or pale yellow. Do this after quitting to flush the nicotine from your body.  Learn to predict your moods. Do not let a bad situation be your excuse to have a cigarette. Some situations in your life might tempt you into wanting a cigarette.  Never have "just one" cigarette. It leads to wanting another and another. Remind yourself of your decision to quit.  Change habits associated with smoking. If you smoked while driving or when feeling stressed, try other activities to replace smoking. Stand up when drinking your coffee. Brush your teeth after eating. Sit in a different chair when you read the paper. Avoid alcohol while trying to quit, and try to drink fewer caffeinated beverages. Alcohol and caffeine may urge you to smoke.  Avoid foods and drinks that can trigger a desire to smoke, such as sugary or spicy foods and alcohol.  Ask people who smoke not to smoke around you.  Have  something planned to do right after eating or having a cup of coffee. For example, plan to take a walk or exercise.  Try a relaxation exercise to calm you down and decrease your stress. Remember, you may be tense and nervous for the first 2 weeks after you quit, but this will pass.  Find new activities to keep your hands busy. Play with a pen, coin, or rubber band. Doodle or draw things on paper.  Brush your teeth right after eating. This will help cut down on the craving for the taste of tobacco after meals. You can also try mouthwash.   Use oral substitutes in place of cigarettes. Try using lemon drops, carrots, cinnamon sticks, or chewing gum. Keep them handy so they are available when you have the urge to smoke.  When you have the urge to smoke, try deep breathing.  Designate your home as a nonsmoking area.  If you are a heavy smoker, ask your health care provider about a prescription for nicotine chewing gum. It can ease your withdrawal from nicotine.  Reward yourself. Set aside the cigarette money you save and buy yourself something nice.  Look for support from others. Join a support group or smoking cessation program. Ask someone at home or at work to help you with your plan to quit smoking.  Always ask yourself, "Do I need this cigarette or is this just a reflex?" Tell yourself, "Today, I choose not to smoke," or "I do not want to smoke." You are reminding yourself of your decision to quit.  Do not replace cigarette smoking with electronic cigarettes (commonly called e-cigarettes). The safety of e-cigarettes is unknown, and some may contain harmful chemicals.  If you relapse, do not give up! Plan ahead and think about what you will do the next time you get the urge to smoke. HOW WILL I FEEL WHEN I QUIT SMOKING? You may have symptoms of withdrawal because your body is used to nicotine (the addictive substance in cigarettes). You may crave cigarettes, be irritable, feel very hungry,  cough often, get headaches, or have difficulty concentrating. The withdrawal symptoms are only temporary. They are strongest when you first quit but will go away within 10-14 days. When withdrawal symptoms occur, stay in control. Think about your reasons for quitting. Remind yourself that these are signs that your body is healing and getting used to being without cigarettes. Remember that withdrawal symptoms are easier to treat than  the major diseases that smoking can cause.  Even after the withdrawal is over, expect periodic urges to smoke. However, these cravings are generally short lived and will go away whether you smoke or not. Do not smoke! WHAT RESOURCES ARE AVAILABLE TO HELP ME QUIT SMOKING? Your health care provider can direct you to community resources or hospitals for support, which may include:  Group support.  Education.  Hypnosis.  Therapy.   This information is not intended to replace advice given to you by your health care provider. Make sure you discuss any questions you have with your health care provider.   Document Released: 12/12/2003 Document Revised: 04/05/2014 Document Reviewed: 08/31/2012 Elsevier Interactive Patient Education 2016 [employer].   Emergency Department Resource Guide 1) Find a Doctor and Pay Out of Pocket Although you won't have to find out who is covered by your insurance plan, it is a good idea to ask around and get recommendations. You will then need to call the office and see if the doctor you have chosen will accept you as a new patient and what types of options they offer for patients who are self-pay. Some doctors offer discounts or will set up payment plans for their patients who do not have insurance, but you will need to ask so you aren't surprised when you get to your appointment.  2) Contact Your Local Health Department Not all health departments have doctors that can see patients for sick visits, but many do, so it is worth a call to see  if yours does. If you don't know where your local health department is, you can check in your phone book. The CDC also has a tool to help you locate your state's health department, and many state websites also have listings of all of their local health departments.  3) Find a Walk-in Clinic If your illness is not likely to be very severe or complicated, you may want to try a walk in clinic. These are popping up all over the country in pharmacies, drugstores, and shopping centers. They're usually staffed by nurse practitioners or physician assistants that have been trained to treat common illnesses and complaints. They're usually fairly quick and inexpensive. However, if you have serious medical issues or chronic medical problems, these are probably not your best option.  No Primary Care Doctor: - Call [facility] at  [phone] - they can help you locate a primary care doctor that  accepts your insurance, provides certain services, etc. - Physician Referral Service- [phone]  Chronic Pain Problems: Organization         Address  Phone   Notes  [facility]  [phone] Patients need to be referred by their primary care doctor.   Medication Assistance: Organization         Address  Phone   Notes  [facility] Medication [facility] [address] [place] [place], [place] [address] [phone] --Must be a resident of [facility] -- Must have NO insurance coverage whatsoever (no Medicaid/ Medicare, etc.) -- The pt. MUST have a primary care doctor that directs their care regularly and follows them in the community   [facility]  [phone]   [place]  [phone]     Dental Care: Organization         Address  Phone  Notes  [facility] Department of [facility] [address] [place], [place] [phone] Accepts children up to age 33 who  are enrolled in Medicaid or [place] Health Choice; pregnant women with a  Medicaid card; and children who have applied for Medicaid or [place] Health Choice, but were declined, whose parents can pay a reduced fee at time of service.  [facility] Department of [facility]  [address], [place] [phone] Accepts children up to age 62 who are enrolled in [place] or [place] Health Choice; pregnant women with a Medicaid card; and children who have applied for Medicaid or [place] Health Choice, but were declined, whose parents can pay a reduced fee at time of service.  [facility]  [address] [place], [place] [phone] Patients are seen by appointment only. Walk-ins are not accepted. [facility] will see patients 13 years of age and older. Monday - Tuesday (8am-5pm) Most Wednesdays (8:30-5pm) $30 per visit, cash only  [facility]  [address], [facility] [phone] Patients are seen by appointment only. Walk-ins are not accepted. [facility] will see patients 39 years of age and older. One Wednesday Evening (Monthly: Volunteer Based).  $30 per visit, cash only  [employer] [facility] [employer]  [phone] for adults; Children under age 4, call Graduate Pediatric Dentistry at [phone]. Children aged 56-14, please call [phone] to request a pediatric application.  Dental services are provided in all areas of dental care including fillings, crowns and bridges, complete and partial dentures, implants, gum treatment, root canals, and extractions. Preventive care is also provided. Treatment is provided to both adults and children. Patients are selected via a lottery and there is often a waiting list.   [facility] [address], [place]  [phone] [email or web address]   [facility] [address] [place], [place] [phone], Ext. [phone] Second and Fourth Thursday of each month, opens at 6:30 AM; Clinic ends at 9 AM.  Patients are seen on a  first-come first-served basis, and a limited number are seen during each clinic.   [facility]  [address] [place], [place] [phone]   Eligibility Requirements You must have lived in [place], [place], or [place] counties for at least the last three months.   You cannot be eligible for state or federal sponsored [place], including [employer], [place], or [employer].   You generally cannot be eligible for healthcare insurance through your employer.    How to apply: Eligibility screenings are held every Tuesday and Wednesday afternoon from 1:00 pm until 4:00 pm. You do not need an appointment for the interview!  [facility] [address] [place], [place] [phone]   [facility]  [phone]   [facility]  [phone]   [facility]  [phone]

## 2015-03-26 NOTE — ED Notes (Signed)
Pt reports left lower tooth broken and abscess. Pt reports headache, diarrhea and earache at this time.
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
